# Patient Record
Sex: Male | Born: 1990 | Race: Black or African American | Hispanic: No | Marital: Single | Smoking: Current every day smoker
Health system: Southern US, Community
[De-identification: ages and names within clinical notes are randomized; demographics above are authoritative.]

## PROBLEM LIST (undated history)

## (undated) DIAGNOSIS — J45909 Unspecified asthma, uncomplicated: Secondary | ICD-10-CM

---

## 2018-09-11 ENCOUNTER — Emergency Department (HOSPITAL_COMMUNITY): Payer: Self-pay

## 2018-09-11 ENCOUNTER — Encounter (HOSPITAL_COMMUNITY): Payer: Self-pay

## 2018-09-11 ENCOUNTER — Other Ambulatory Visit: Payer: Self-pay

## 2018-09-11 ENCOUNTER — Emergency Department (HOSPITAL_COMMUNITY)
Admission: EM | Admit: 2018-09-11 | Discharge: 2018-09-11 | Disposition: A | Discharge status: Home / Self Care | Payer: Self-pay | Attending: Emergency Medicine | Admitting: Emergency Medicine

## 2018-09-11 DIAGNOSIS — X19XXXA Contact with other heat and hot substances, initial encounter: Secondary | ICD-10-CM | POA: Insufficient documentation

## 2018-09-11 DIAGNOSIS — Y999 Unspecified external cause status: Secondary | ICD-10-CM | POA: Insufficient documentation

## 2018-09-11 DIAGNOSIS — Y9389 Activity, other specified: Secondary | ICD-10-CM | POA: Insufficient documentation

## 2018-09-11 DIAGNOSIS — H5712 Ocular pain, left eye: Secondary | ICD-10-CM | POA: Insufficient documentation

## 2018-09-11 DIAGNOSIS — S8992XA Unspecified injury of left lower leg, initial encounter: Secondary | ICD-10-CM

## 2018-09-11 DIAGNOSIS — H02846 Edema of left eye, unspecified eyelid: Secondary | ICD-10-CM | POA: Insufficient documentation

## 2018-09-11 DIAGNOSIS — J45909 Unspecified asthma, uncomplicated: Secondary | ICD-10-CM | POA: Insufficient documentation

## 2018-09-11 DIAGNOSIS — Y92410 Unspecified street and highway as the place of occurrence of the external cause: Secondary | ICD-10-CM | POA: Insufficient documentation

## 2018-09-11 DIAGNOSIS — T22011A Burn of unspecified degree of right forearm, initial encounter: Secondary | ICD-10-CM | POA: Insufficient documentation

## 2018-09-11 DIAGNOSIS — S0083XA Contusion of other part of head, initial encounter: Secondary | ICD-10-CM | POA: Insufficient documentation

## 2018-09-11 DIAGNOSIS — H0289 Other specified disorders of eyelid: Secondary | ICD-10-CM

## 2018-09-11 DIAGNOSIS — S0031XA Abrasion of nose, initial encounter: Secondary | ICD-10-CM | POA: Insufficient documentation

## 2018-09-11 DIAGNOSIS — S80212A Abrasion, left knee, initial encounter: Secondary | ICD-10-CM | POA: Insufficient documentation

## 2018-09-11 HISTORY — DX: Unspecified asthma, uncomplicated: J45.909

## 2018-09-11 MED ORDER — ACETAMINOPHEN 500 MG PO TABS
1000.0000 mg | ORAL_TABLET | Freq: Once | ORAL | Status: AC
  Administered 2018-09-11: 1000 mg via ORAL
  Filled 2018-09-11: qty 2
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
## 2018-09-11 NOTE — Discharge Instructions (Addendum)
Take ibuprofen 3 times a day with meals.  Do not take other anti-inflammatories at the same time (Advil, Motrin, naproxen, Aleve). You may supplement with Tylenol if you need further pain control. Use  muscle creams (salon pas, icy hot, bengay) for further pain control.  Use ice packs or heating pads if this helps control your pain. You will likely have continued muscle stiffness and soreness over the next couple days.  Follow-up with primary care in 1 week if your symptoms are not improving. Follow up with the eye doctor or in the ER if you develop severe eye pain, inability to move your eye, or vision changes.  Return to the emergency room if you develop vision changes, vomiting, slurred speech, numbness, loss of bowel or bladder control, or any new or worsening symptoms.
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
## 2018-09-11 NOTE — ED Notes (Signed)
Patient transported to CT 
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
## 2019-09-13 ENCOUNTER — Encounter (HOSPITAL_COMMUNITY): Payer: Self-pay

## 2019-09-13 ENCOUNTER — Ambulatory Visit (HOSPITAL_COMMUNITY)
Admission: EM | Admit: 2019-09-13 | Discharge: 2019-09-13 | Disposition: A | Discharge status: Home / Self Care | Payer: Self-pay | Attending: Urgent Care | Admitting: Urgent Care

## 2019-09-13 ENCOUNTER — Other Ambulatory Visit: Payer: Self-pay

## 2019-09-13 DIAGNOSIS — R21 Rash and other nonspecific skin eruption: Secondary | ICD-10-CM | POA: Insufficient documentation

## 2019-09-13 DIAGNOSIS — N4889 Other specified disorders of penis: Secondary | ICD-10-CM | POA: Insufficient documentation

## 2019-09-13 LAB — HIV ANTIBODY (ROUTINE TESTING W REFLEX): HIV Screen 4th Generation wRfx: NONREACTIVE

## 2019-09-13 MED ORDER — ACYCLOVIR 400 MG PO TABS: 400.0000 mg | ORAL_TABLET | Freq: Three times a day (TID) | ORAL | 5 refills | Status: AC

## 2019-09-13 NOTE — ED Triage Notes (Signed)
Patient presents to Urgent Care with complaints of two sores on the tip of his penis since about a week ago. Patient reports he has only had intercourse with one partner, who reports testing negative for STDs.

## 2019-09-13 NOTE — ED Provider Notes (Signed)
  [place]   MRN: [id number] DOB: 1990-10-25  Subjective:   Ruben Martin is a 28 y.o. male presenting for 1 week history of persistent sores over either side of his penis.  Patient states that the first sore showed up on the left side and he applied ointment to the area with some relief.  But in the past few days he developed sores on the right side.  The sores are generally painful with contact and pressure.  He is sexually active with one male only.  States that she got tested for STIs and was negative.  Denies dysuria, hematuria, urinary frequency, penile discharge, penile swelling, testicular pain, testicular swelling, anal pain, groin pain.   He is not currently taking any medications.   No Known Allergies  Past Medical History:  Diagnosis Date  . Asthma      History reviewed. No pertinent surgical history.  Family History  Problem Relation Age of Onset  . Healthy Mother   . Healthy Father     Social History   Tobacco Use  . Smoking status: Current Every Day Smoker    Packs/day: 0.20    Types: Cigarettes, Cigars  . Smokeless tobacco: Never Used  . Tobacco comment: 1-3 black and milds  Substance Use Topics  . Alcohol use: Yes    Comment: socially  . Drug use: Yes    Types: Marijuana    ROS   Objective:   Vitals: BP (!) 138/58 (BP Location: Right Arm)   Pulse 83   Temp 98.4 F (36.9 C) (Oral)   Resp 15   SpO2 100%   Physical Exam Constitutional:      General: He is not in acute distress.    Appearance: Normal appearance. He is well-developed and normal weight. He is not ill-appearing, toxic-appearing or diaphoretic.  HENT:     Head: Normocephalic and atraumatic.     Right Ear: External ear normal.     Left Ear: External ear normal.     Nose: Nose normal.     Mouth/Throat:     Pharynx: Oropharynx is clear.  Eyes:     General: No scleral icterus.       Right eye: No discharge.        Left eye: No discharge.     Extraocular  Movements: Extraocular movements intact.     Pupils: Pupils are equal, round, and reactive to light.  Neck:     Musculoskeletal: Normal range of motion.  Cardiovascular:     Rate and Rhythm: Normal rate.  Pulmonary:     Effort: Pulmonary effort is normal.  Genitourinary:   Neurological:     Mental Status: He is alert and oriented to person, place, and time.  Psychiatric:        Mood and Affect: Mood normal.        Behavior: Behavior normal.        Thought Content: Thought content normal.        Judgment: Judgment normal.      Assessment and Plan :   1. Rash of genital area   2. Penile pain     Will cover for genital HSV with acyclovir, labs pending.  Counseled on abstaining for at least a week.  Will discuss further management once we have results. Counseled patient on potential for adverse effects with medications prescribed/recommended today, ER and return-to-clinic precautions discussed, patient verbalized understanding.    Jaynee Eagles, [place] 09/13/19 7035

## 2019-09-14 LAB — RPR: RPR Ser Ql: NONREACTIVE

## 2019-09-15 LAB — CYTOLOGY, (ORAL, ANAL, URETHRAL) ANCILLARY ONLY
Chlamydia: NEGATIVE
Neisseria Gonorrhea: NEGATIVE
Trichomonas: NEGATIVE

## 2019-09-16 LAB — HSV CULTURE AND TYPING

## 2019-09-19 ENCOUNTER — Telehealth (HOSPITAL_COMMUNITY): Payer: Self-pay | Admitting: Emergency Medicine

## 2019-09-19 NOTE — Telephone Encounter (Signed)
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 Pt was given acyclovir during visit. Attempted to reach patient. No answer at this time. Voicemail left.

## 2019-09-20 ENCOUNTER — Telehealth: Payer: Self-pay | Admitting: Emergency Medicine

## 2019-09-20 NOTE — Telephone Encounter (Signed)
Patient contacted by phone and made aware of positive HSV   results. Pt verbalized understanding and had all questions answered.
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
## 2021-05-07 ENCOUNTER — Encounter (HOSPITAL_COMMUNITY): Payer: Self-pay | Admitting: Emergency Medicine

## 2021-05-07 ENCOUNTER — Other Ambulatory Visit: Payer: Self-pay

## 2021-05-07 ENCOUNTER — Emergency Department (HOSPITAL_COMMUNITY): Payer: Self-pay

## 2021-05-07 ENCOUNTER — Emergency Department (HOSPITAL_COMMUNITY)
Admission: EM | Admit: 2021-05-07 | Discharge: 2021-05-07 | Disposition: A | Discharge status: Home / Self Care | Payer: Self-pay | Attending: Physician Assistant | Admitting: Physician Assistant

## 2021-05-07 DIAGNOSIS — L089 Local infection of the skin and subcutaneous tissue, unspecified: Secondary | ICD-10-CM | POA: Insufficient documentation

## 2021-05-07 DIAGNOSIS — A6000 Herpesviral infection of urogenital system, unspecified: Secondary | ICD-10-CM | POA: Insufficient documentation

## 2021-05-07 DIAGNOSIS — Z5321 Procedure and treatment not carried out due to patient leaving prior to being seen by health care provider: Secondary | ICD-10-CM | POA: Insufficient documentation

## 2021-05-07 NOTE — ED Notes (Signed)
Pt states he has kids at home and cant stay to be seen

## 2021-05-07 NOTE — ED Notes (Signed)
Removed pt from floor called 3x  no answer

## 2021-05-07 NOTE — ED Provider Notes (Signed)
Emergency Medicine Provider Triage Evaluation Note  Ruben Martin , a 30 y.o. male  was evaluated in triage.  Pt complains of right hand infection onset several weeks ago. Pt reports he noticed it while in [place].  HE was able to squeeze it at that time and express some pus.  Since that time it has remained swollen and tender.  He denies fever, chills, nausea and vomiting.    Pt  also c/o several days of genital rash.  Pt repots hx of genital herpes.   Review of Systems  Positive: Hand infection, genital rash Negative: Chest pain, SOB  Physical Exam  There were no vitals taken for this visit. Gen:   Awake, no distress   Resp:  Normal effort  MSK:   Moves extremities without difficulty  Other:  Wound noted to the right hand. Genital exam deferred.  Medical Decision Making  Medically screening exam initiated at 3:22 AM.  Appropriate orders placed.  Ruben Martin was informed that the remainder of the evaluation will be completed by another provider, this initial triage assessment does not replace that evaluation, and the importance of remaining in the ED until their evaluation is complete.  Genital rash, hand infection.   Ruben Martin, Boyd Kerbs 05/07/21 [id number]    Glynn Octave, MD 05/07/21 [phone]

## 2021-05-07 NOTE — ED Triage Notes (Signed)
Patient reports right proximal ring finger infection with swelling /drainage onset 2 weeks ago , patient added genital herpes flare up 2 days ago with skin "bump". No penile drainage or fever .

## 2021-05-07 NOTE — ED Notes (Signed)
Called pt no answer °

## 2022-11-13 IMAGING — DX DG HAND COMPLETE 3+V*R*
3 series · 3 of 3 positions shown · non-contrast
Comparison: No prior.

CLINICAL DATA: Pain.  Possible infection.

EXAM:
RIGHT HAND - COMPLETE 3+ VIEW

[hand obl]
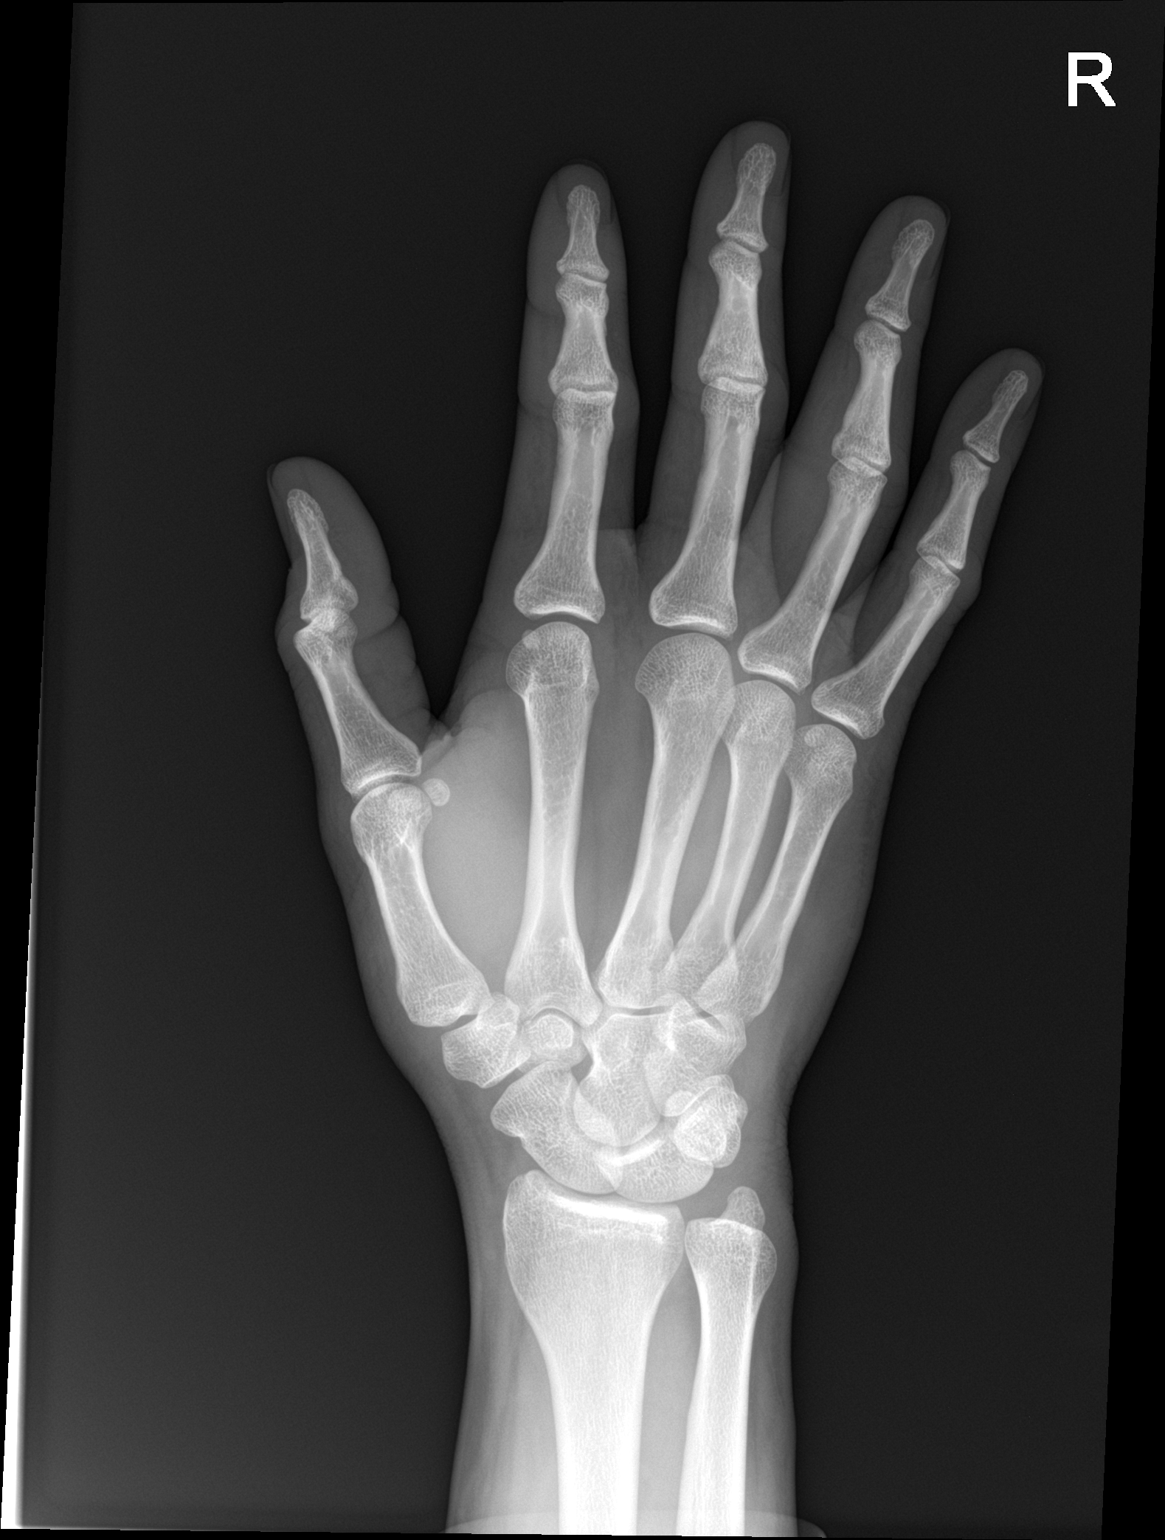

[hand lat]
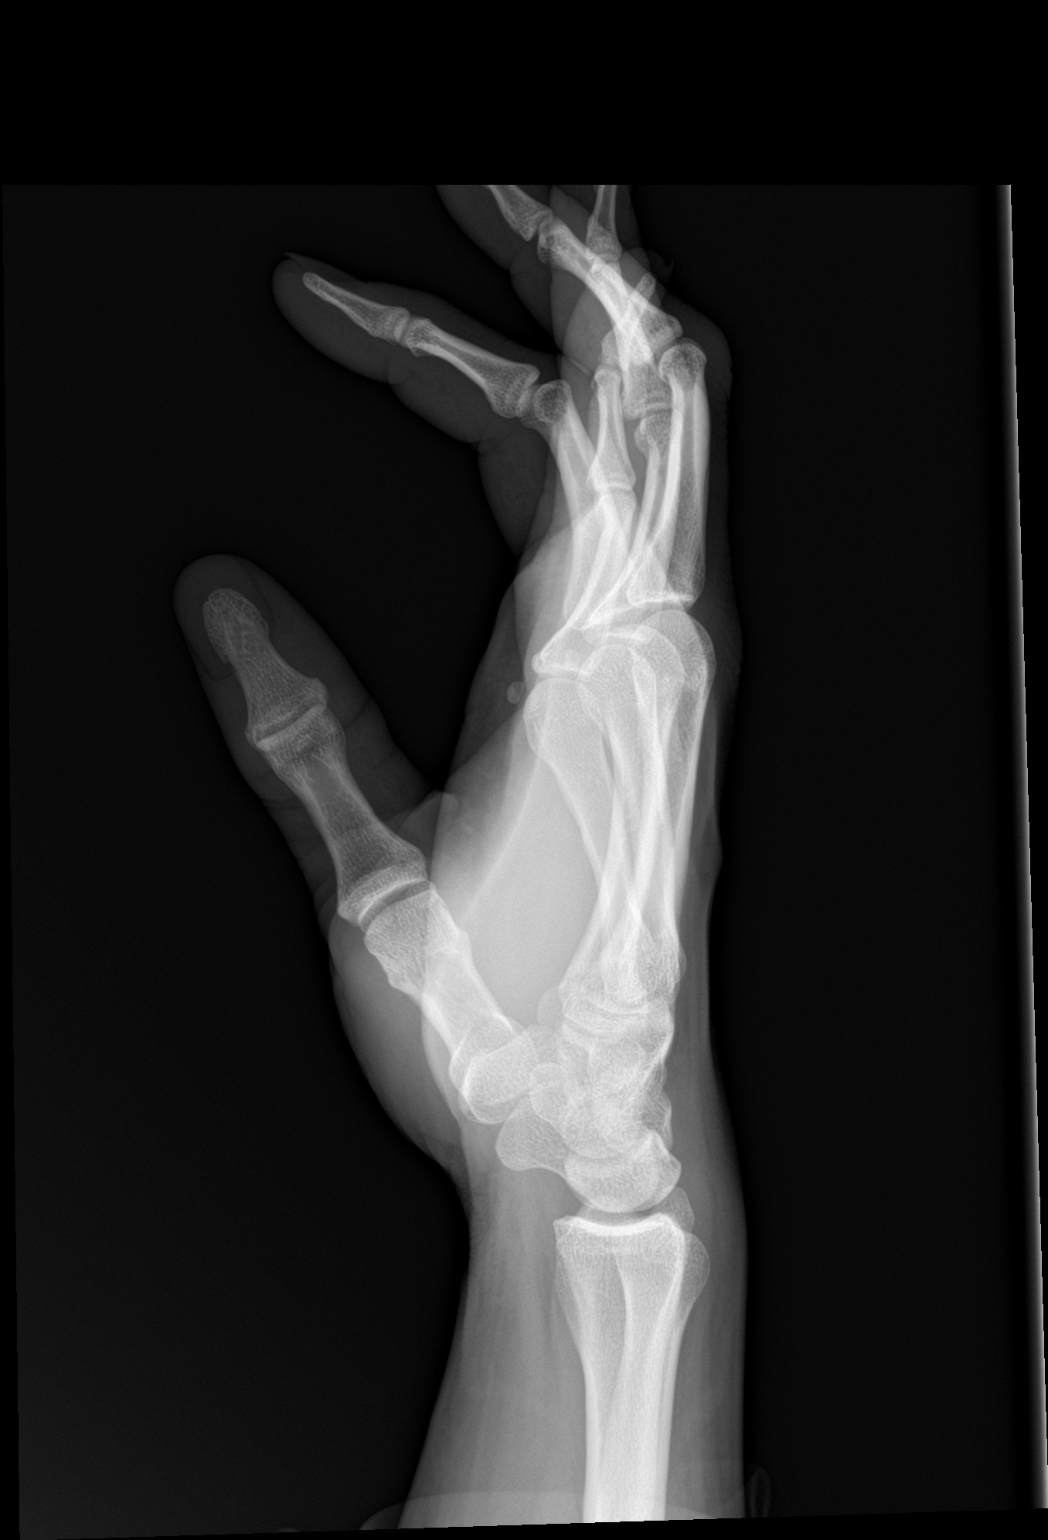

[hand pa]
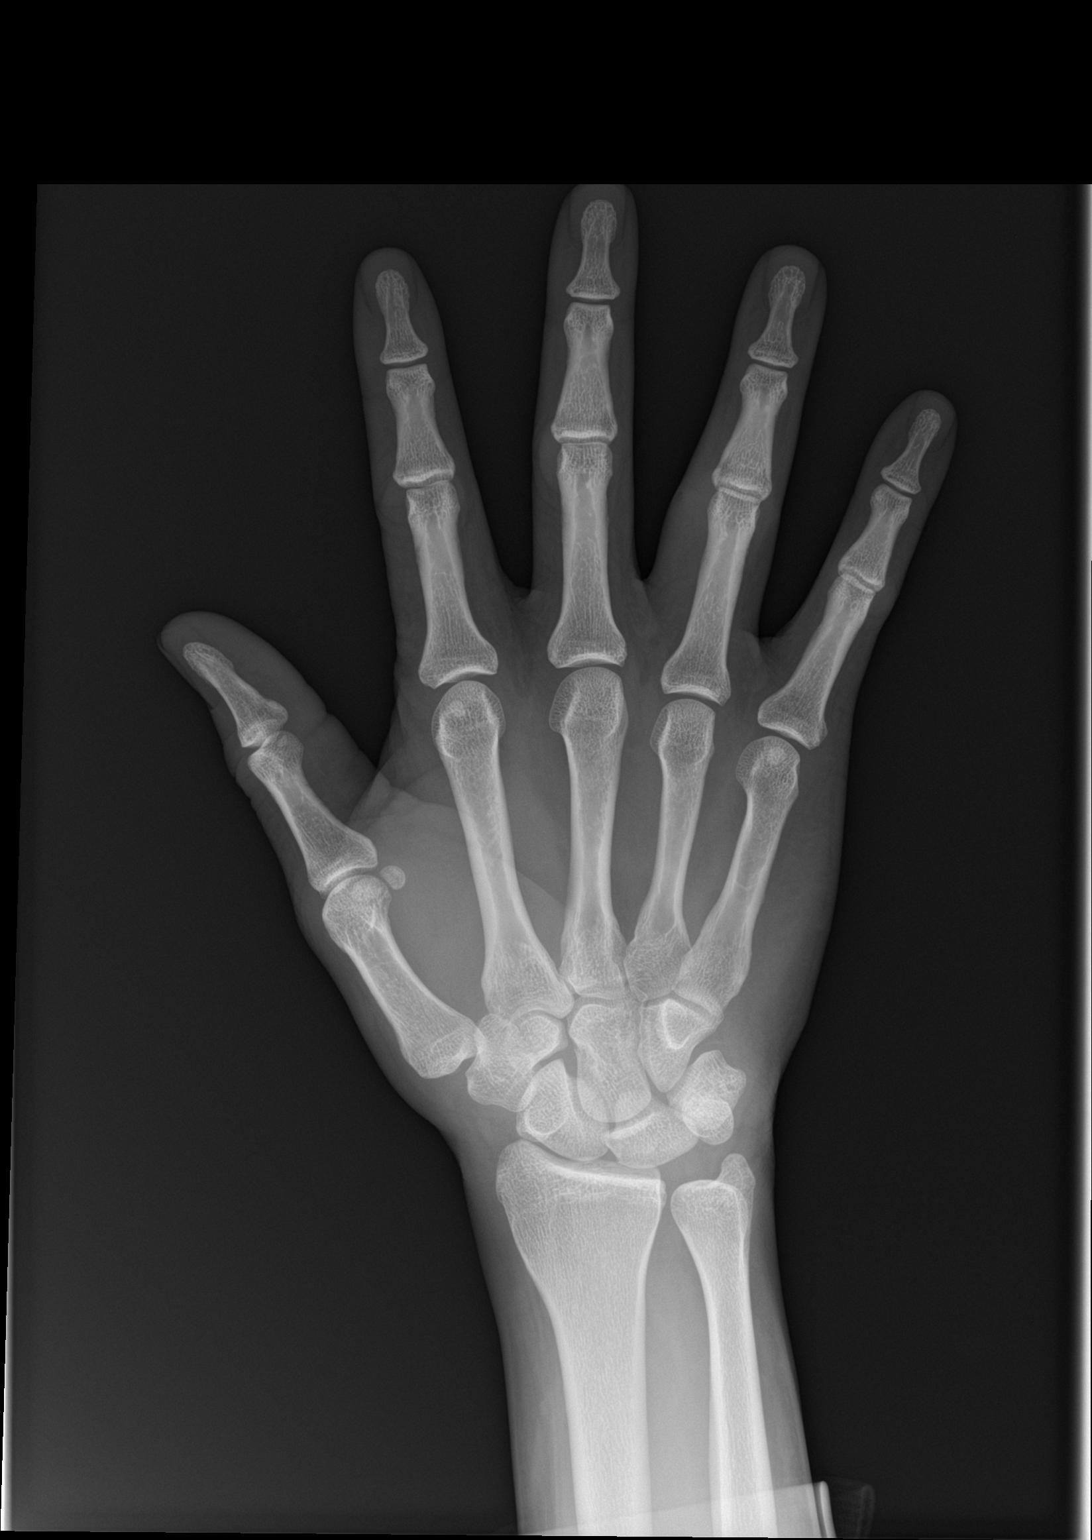

[3 of 3 positions shown; findings below may reference images not displayed]

FINDINGS: Soft tissue swelling right fourth digit. No radiopaque foreign body.
No acute or focal bony abnormality. No evidence of fracture or
dislocation.
IMPRESSION: Soft tissue swelling right fourth digit. No radiopaque foreign body.
No acute bony abnormality.

## 2023-02-16 ENCOUNTER — Telehealth: Payer: Self-pay

## 2023-02-16 NOTE — Telephone Encounter (Signed)
LVM for patient to call back. AS, CMA
